# Patient Record
Sex: Male | Born: 1980 | Hispanic: Yes | Marital: Married | Smoking: Never smoker
Health system: Southern US, Community
[De-identification: ages and names within clinical notes are randomized; demographics above are authoritative.]

## PROBLEM LIST (undated history)

## (undated) DIAGNOSIS — F329 Major depressive disorder, single episode, unspecified: Secondary | ICD-10-CM

## (undated) DIAGNOSIS — F32A Depression, unspecified: Secondary | ICD-10-CM

## (undated) DIAGNOSIS — F419 Anxiety disorder, unspecified: Secondary | ICD-10-CM

## (undated) HISTORY — DX: Anxiety disorder, unspecified: F41.9

## (undated) HISTORY — DX: Depression, unspecified: F32.A

## (undated) HISTORY — DX: Major depressive disorder, single episode, unspecified: F32.9

---

## 2014-02-10 ENCOUNTER — Telehealth: Payer: Self-pay | Admitting: Cardiovascular Disease

## 2014-02-10 ENCOUNTER — Ambulatory Visit (INDEPENDENT_AMBULATORY_CARE_PROVIDER_SITE_OTHER): Payer: 59 | Admitting: Cardiovascular Disease

## 2014-02-10 ENCOUNTER — Encounter: Payer: Self-pay | Admitting: Cardiovascular Disease

## 2014-02-10 VITALS — BP 110/78 | HR 56 | Wt 159.1 lb

## 2014-02-10 DIAGNOSIS — R079 Chest pain, unspecified: Secondary | ICD-10-CM

## 2014-02-10 NOTE — Assessment & Plan Note (Signed)
Atypical but recurrent with father having prinzmetal's angina.  Clinical picture not consistent with pericardial pain. Favor cardiac CT over stress echo as it will show me  Pericardium Aorta Intramyocardial course of coronaries And any coronary anomalies  All of which are important to exclude in young person with recurrent pain  He is also thin with relative bradycardia and would have  Superb images

## 2014-02-10 NOTE — Telephone Encounter (Signed)
ROI Faxed To [facility] at [phone]

## 2014-02-10 NOTE — Patient Instructions (Signed)
Your physician recommends that you schedule a follow-up appointment in:  AS NEEDED Your physician recommends that you continue on your current medications as directed. Please refer to the Current Medication list given to you today.  Your physician has requested that you have cardiac CT. Cardiac computed tomography (CT) is a painless test that uses an x-ray machine to take clear, detailed pictures of your heart. For further information please visit [email or web address]. Please follow instruction sheet as given.

## 2014-02-10 NOTE — Progress Notes (Signed)
Patient ID: Adam MassonCarlos Barker, male   DOB: [birth year], 33 y.o.   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  While there in July has severe SSCP.  Sharp mostly at rest Longest episode 2 hours.  Seen in ER r/o with no f/u considered non cardiac.  He indicates his dad has prinzmetal's angina.  No meds and no other CRF;s  No history of lupus, inflammatory disease  Febrile illness.  No history of periciditis.  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  Usually occurs at night and can wake him from sleep.  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  All other systems reviewed and negative   General: Affect appropriate Healthy:  appears stated age HEENT: normal Neck supple with no adenopathy JVP normal no bruits no thyromegaly Lungs clear with no wheezing and good diaphragmatic motion Heart:  S1/S2 no murmur,rub, gallop or click PMI normal Abdomen: benighn, BS positve, no tenderness, no AAA no bruit.  No HSM or HJR Distal pulses intact with no bruits No edema Neuro non-focal Skin warm and dry No muscular weakness  Medications No current outpatient prescriptions on file.   No current facility-administered medications for this visit.    Allergies Review of patient's allergies indicates not on file.  Family History: Family History  Problem Relation Age of Onset  . Hypertension Father   . Hypertension Sister   . Hypertension Brother      Social History: History   Social History  . Marital Status: Married    Spouse Name: N/A    Number of Children: N/A  . Years of Education: N/A   Occupational History  . Not on file.   Social History Main Topics  . Smoking status: Never Smoker   . Smokeless tobacco: Not on file  . Alcohol Use: No  . Drug Use: Not on file  . Sexual Activity: Not on file   Other Topics Concern  . Not on file   Social History Narrative  . No narrative on file    Electrocardiogram:  NSR normal ECG  Rate 56 borderline voltage for LVH   Assessment and Plan

## 2014-02-16 ENCOUNTER — Other Ambulatory Visit: Payer: Self-pay | Admitting: *Deleted

## 2014-02-16 DIAGNOSIS — R079 Chest pain, unspecified: Secondary | ICD-10-CM

## 2014-02-18 ENCOUNTER — Other Ambulatory Visit: Payer: Self-pay | Admitting: *Deleted

## 2014-02-18 DIAGNOSIS — R079 Chest pain, unspecified: Secondary | ICD-10-CM

## 2014-03-02 ENCOUNTER — Other Ambulatory Visit (HOSPITAL_COMMUNITY): Payer: 59

## 2014-03-06 ENCOUNTER — Other Ambulatory Visit (HOSPITAL_COMMUNITY): Payer: 59

## 2014-03-11 ENCOUNTER — Telehealth: Payer: Self-pay | Admitting: Cardiovascular Disease

## 2014-03-11 ENCOUNTER — Encounter (HOSPITAL_COMMUNITY): Payer: 59

## 2014-03-11 NOTE — Telephone Encounter (Signed)
Records rec From [facility] gave to Operator Room

## 2014-03-20 ENCOUNTER — Telehealth (HOSPITAL_COMMUNITY): Payer: Self-pay

## 2014-03-22 ENCOUNTER — Emergency Department (HOSPITAL_COMMUNITY)
Admission: EM | Admit: 2014-03-22 | Discharge: 2014-03-22 | Disposition: A | Discharge status: Home / Self Care | Payer: 59 | Attending: Emergency Medicine | Admitting: Emergency Medicine

## 2014-03-22 ENCOUNTER — Encounter (HOSPITAL_COMMUNITY): Payer: Self-pay | Admitting: Emergency Medicine

## 2014-03-22 ENCOUNTER — Emergency Department (HOSPITAL_COMMUNITY): Payer: 59

## 2014-03-22 DIAGNOSIS — R079 Chest pain, unspecified: Secondary | ICD-10-CM

## 2014-03-22 DIAGNOSIS — R110 Nausea: Secondary | ICD-10-CM | POA: Insufficient documentation

## 2014-03-22 DIAGNOSIS — F411 Generalized anxiety disorder: Secondary | ICD-10-CM | POA: Insufficient documentation

## 2014-03-22 LAB — HEPATIC FUNCTION PANEL
ALT: 27 U/L (ref 0–53)
AST: 23 U/L (ref 0–37)
Albumin: 4.4 g/dL (ref 3.5–5.2)
Alkaline Phosphatase: 68 U/L (ref 39–117)
Total Bilirubin: 0.6 mg/dL (ref 0.3–1.2)
Total Protein: 7.7 g/dL (ref 6.0–8.3)

## 2014-03-22 LAB — BASIC METABOLIC PANEL
BUN: 14 mg/dL (ref 6–23)
CO2: 25 mEq/L (ref 19–32)
CREATININE: 0.67 mg/dL (ref 0.50–1.35)
Calcium: 9.6 mg/dL (ref 8.4–10.5)
Chloride: 103 mEq/L (ref 96–112)
GFR calc Af Amer: >90 mL/min (ref >90)
Glucose, Bld: 124 mg/dL — ABNORMAL HIGH (ref 70–99)
POTASSIUM: 3.9 meq/L (ref 3.7–5.3)
Sodium: 143 mEq/L (ref 137–147)

## 2014-03-22 LAB — LIPASE, BLOOD: Lipase: 30 U/L (ref 11–59)

## 2014-03-22 LAB — I-STAT TROPONIN, ED
TROPONIN I, POC: 0 ng/mL (ref 0.00–0.08)
TROPONIN I, POC: 0 ng/mL (ref 0.00–0.08)

## 2014-03-22 LAB — CBC
HEMATOCRIT: 46.5 % (ref 39.0–52.0)
Hemoglobin: 15.5 g/dL (ref 13.0–17.0)
MCH: 30.3 pg (ref 26.0–34.0)
MCHC: 33.3 g/dL (ref 30.0–36.0)
MCV: 90.8 fL (ref 78.0–100.0)
Platelets: 237 10*3/uL (ref 150–400)
RBC: 5.12 MIL/uL (ref 4.22–5.81)
RDW: 12.5 % (ref 11.5–15.5)
WBC: 11.4 10*3/uL — AB (ref 4.0–10.5)

## 2014-03-22 MED ORDER — NITROGLYCERIN 2 % TD OINT
1.0000 [in_us] | TOPICAL_OINTMENT | Freq: Once | TRANSDERMAL | Status: AC
  Administered 2014-03-22: 1 [in_us] via TOPICAL
  Filled 2014-03-22: qty 1

## 2014-03-22 MED ORDER — ASPIRIN 81 MG PO CHEW
324.0000 mg | CHEWABLE_TABLET | Freq: Once | ORAL | Status: AC
  Administered 2014-03-22: 324 mg via ORAL
  Filled 2014-03-22: qty 4

## 2014-03-22 MED ORDER — MORPHINE SULFATE 4 MG/ML IJ SOLN
4.0000 mg | Freq: Once | INTRAMUSCULAR | Status: AC
  Administered 2014-03-22: 4 mg via INTRAVENOUS
  Filled 2014-03-22: qty 1

## 2014-03-22 MED ORDER — NITROGLYCERIN 0.4 MG SL SUBL: 0.4000 mg | SUBLINGUAL_TABLET | SUBLINGUAL | Status: DC | PRN

## 2014-03-22 NOTE — Discharge Instructions (Signed)
Keep your appointments this week to get your echocardiogram done and the stress test done. If your pain returns, try the nitroglycerin spray while sitting down to see if it helps with your pain. If the pain doesn't go away go to the ED.

## 2014-03-22 NOTE — ED Notes (Signed)
I-STAT Troponin 0.00, not crossing over into the chart. Dr. Lynelle DoctorKnapp made aware.

## 2014-03-22 NOTE — ED Notes (Signed)
Rea at main lab notified that blue top blood tube has been sent.

## 2014-03-22 NOTE — ED Notes (Signed)
Pt. Stated, I have an appt with a cardiologist on Monday and Tuesday with [facility].

## 2014-03-22 NOTE — ED Notes (Signed)
Pt. Stated, I started having CP about hour half ago.Py. NV.

## 2014-03-22 NOTE — ED Provider Notes (Signed)
CSN: [id number]     Arrival date & time 03/22/14  1039 History   First MD Initiated Contact with Patient 03/22/14 1107     Chief Complaint  Patient presents with  . Chest Pain     (Consider location/radiation/quality/duration/timing/severity/associated sxs/prior Treatment) HPI Patient reports about 2 hours ago which would be about 9:00 he had acute onset of severe lower chest central pain that he describes as a squeezing discomfort. He had nausea without vomiting. He denies diaphoresis or shortness of breath. The pain does not radiate. He states he had an episode like this last summer that he went to the ED for and had testing done that were all normal. He states since that about every 3 months he has episodes that are not as intense and do not last as long. He states nothing he does makes the pain hurt more, nothing he does makes it feel better. He took ibuprofen without relief, today. He does not relate the pains to food. He states he ate a chicken sub last night with fried turnovers. He denies having a burning sensation or burning fluid coming up into his throat. He denies pain on swallowing. He states he has a very physical job that he does not get chest pain or shortness of breath doing his job. He has been recently seen by a cardiologist and has a echocardiogram and a stress test scheduled in 2 and 3 days respectively. Patient is concerned he is having "spasms" of his heart. 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 He denies any cocaine use. He states his father has angina but has not had MI. His father is [id number].   PCP  None [facility]   History reviewed. No pertinent past medical history. History reviewed. No pertinent past surgical history. Family History  Problem Relation Age of Onset  . Hypertension Father   . Hypertension Sister   . Hypertension Brother    History  Substance Use Topics  . Smoking status: Never Smoker   . Smokeless tobacco: Not on file   . Alcohol Use: No  employed  Review of Systems  All other systems reviewed and are negative.     Allergies  Review of patient's allergies indicates no known allergies.  Home Medications   Prior to Admission medications   Medication Sig Start Date End Date Taking? Authorizing Provider  ibuprofen (ADVIL,MOTRIN) 200 MG tablet Take 400 mg by mouth every 6 (six) hours as needed for moderate pain.    Yes Historical Provider, MD   BP 133/85  Pulse 68  Temp(Src) 98.9 F (37.2 C) (Oral)  Resp 14  Ht 5\' 7"  (1.702 m)  Wt 160 lb (72.576 kg)  BMI 25.05 kg/m2  SpO2 99%  Vital signs normal   Physical Exam  Nursing note and vitals reviewed. Constitutional: He is oriented to person, place, and time. He appears well-developed and well-nourished.  Non-toxic appearance. He does not appear ill. No distress.  HENT:  Head: Normocephalic and atraumatic.  Right Ear: External ear normal.  Left Ear: External ear normal.  Nose: Nose normal. No mucosal edema or rhinorrhea.  Mouth/Throat: Oropharynx is clear and moist and mucous membranes are normal. No dental abscesses or uvula swelling.  Eyes: Conjunctivae and EOM are normal. Pupils are equal, round, and reactive to light.  Neck: Normal range of motion and full passive range of motion without pain. Neck supple.  Cardiovascular: Normal rate, regular rhythm and normal heart sounds.  Exam reveals no  gallop and no friction rub.   No murmur heard. Pulmonary/Chest: Effort normal and breath sounds normal. No respiratory distress. He has no wheezes. He has no rhonchi. He has no rales. He exhibits no tenderness and no crepitus.    Area on pain noted  Abdominal: Soft. Normal appearance and bowel sounds are normal. He exhibits no distension. There is no tenderness. There is no rebound and no guarding.  Musculoskeletal: Normal range of motion. He exhibits no edema and no tenderness.  Moves all extremities well.   Neurological: He is alert and oriented to  person, place, and time. He has normal strength. No cranial nerve deficit.  Skin: Skin is warm, dry and intact. No rash noted. No erythema. No pallor.  Psychiatric: His speech is normal and behavior is normal. His mood appears anxious.    ED Course  Procedures (including critical care time)  Medications  nitroGLYCERIN (NITROGLYN) 2 % ointment 1 inch (1 inch Topical Given 03/22/14 1142)  aspirin chewable tablet 324 mg (324 mg Oral Given 03/22/14 1142)  morphine 4 MG/ML injection 4 mg (4 mg Intravenous Given 03/22/14 1142)   12:30 patient states his pain is about a 4/10. We discussed need to get a second troponin.  At time of discharge patient states his pain is a 0. He is going to followup with his cardiologist this week as previously scheduled. He was given a prescription for nitroglycerin to try in case he gets pain again. He was advised if the nitroglycerin did not help he should proceed to the emergency department anyway.  Labs Review Results for orders placed during the hospital encounter of 03/22/14  CBC      Result Value Ref Range   WBC 11.4 (*) 4.0 - 10.5 K/uL   RBC 5.12  4.22 - 5.81 MIL/uL   Hemoglobin 15.5  13.0 - 17.0 g/dL   HCT 40.9  81.1 - 91.4 %   MCV 90.8  78.0 - 100.0 fL   MCH 30.3  26.0 - 34.0 pg   MCHC 33.3  30.0 - 36.0 g/dL   RDW 78.2  95.6 - 21.3 %   Platelets 237  150 - 400 K/uL  BASIC METABOLIC PANEL      Result Value Ref Range   Sodium 143  137 - 147 mEq/L   Potassium 3.9  3.7 - 5.3 mEq/L   Chloride 103  96 - 112 mEq/L   CO2 25  19 - 32 mEq/L   Glucose, Bld 124 (*) 70 - 99 mg/dL   BUN 14  6 - 23 mg/dL   Creatinine, Ser 0.86  0.50 - 1.35 mg/dL   Calcium 9.6  8.4 - 57.8 mg/dL   GFR calc non Af Amer >90  >90 mL/min   GFR calc Af Amer >90  >90 mL/min  LIPASE, BLOOD      Result Value Ref Range   Lipase 30  11 - 59 U/L  HEPATIC FUNCTION PANEL      Result Value Ref Range   Total Protein 7.7  6.0 - 8.3 g/dL   Albumin 4.4  3.5 - 5.2 g/dL   AST 23  0 - 37 U/L    ALT 27  0 - 53 U/L   Alkaline Phosphatase 68  39 - 117 U/L   Total Bilirubin 0.6  0.3 - 1.2 mg/dL   Bilirubin, Direct <4.6  0.0 - 0.3 mg/dL   Indirect Bilirubin NOT CALCULATED  0.3 - 0.9 mg/dL  Rosezena Sensor, ED  Result Value Ref Range   Troponin i, poc 0.00  0.00 - 0.08 ng/mL   Comment 3           I-STAT TROPOININ, ED      Result Value Ref Range   Troponin i, poc 0.00  0.00 - 0.08 ng/mL   Comment 3            Laboratory interpretation all normal    Imaging Review Dg Chest Portable 1 View  03/22/2014   CLINICAL DATA:  CHEST PAIN  EXAM: PORTABLE CHEST - 1 VIEW  COMPARISON:  None available  FINDINGS: Lungs are clear. Heart size and mediastinal contours are within normal limits. No effusion.  No pneumothorax Visualized skeletal structures are unremarkable.  IMPRESSION: No acute cardiopulmonary disease.   Electronically Signed   By: Oley Balmaniel  Hassell M.D.   On: 03/22/2014 11:44     EKG Interpretation   Date/Time:  Sunday Mar 22 2014 10:42:18 EDT Ventricular Rate:  74 PR Interval:  130 QRS Duration: 92 QT Interval:  368 QTC Calculation: 408 R Axis:   81 Text Interpretation:  Normal sinus rhythm Normal ECG No old tracing to  compare Confirmed by Jolly Bleicher  MD-I, Beanca Kiester ([id number]) on 03/22/2014 11:09:13 AM      MDM   patient presents with severe intense lower sternal chest pain that he describes as squeezing. It does not appear to be esophageal in that it does not hurt when he swallows. It also does not appear to be reflux, there is no burning fluid in his throat. However it also does not appear to be cardiac. 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 He already has an appointment to be seen this week to get an echocardiogram and a stress test. He is going to follow through with his cardiologist.    Final diagnoses:  Chest pain     Discharge Medication List as of 03/22/2014  2:38 PM    START taking these  medications   Details  nitroGLYCERIN (NITROSTAT) 0.4 MG SL tablet Place 1 tablet (0.4 mg total) under the tongue every 5 (five) minutes as needed for chest pain., Starting 03/22/2014, Until Discontinued, Print        Plan discharge  Devoria AlbeIva Jatavion Peaster, MD, [facility]    Kymberlie Brazeau L Mizani Dilday, MD 03/22/14 1900

## 2014-03-24 ENCOUNTER — Ambulatory Visit (HOSPITAL_COMMUNITY): Payer: 59 | Attending: Cardiology | Admitting: Radiology

## 2014-03-24 ENCOUNTER — Encounter (INDEPENDENT_AMBULATORY_CARE_PROVIDER_SITE_OTHER): Payer: Self-pay

## 2014-03-24 ENCOUNTER — Telehealth (HOSPITAL_COMMUNITY): Payer: Self-pay

## 2014-03-24 DIAGNOSIS — R079 Chest pain, unspecified: Secondary | ICD-10-CM | POA: Insufficient documentation

## 2014-03-24 NOTE — Progress Notes (Signed)
Echocardiogram performed.  

## 2014-03-25 ENCOUNTER — Ambulatory Visit (HOSPITAL_COMMUNITY)
Admission: RE | Admit: 2014-03-25 | Discharge: 2014-03-25 | Disposition: A | Discharge status: Home / Self Care | Payer: 59 | Source: Ambulatory Visit | Attending: Cardiovascular Disease | Admitting: Cardiovascular Disease

## 2014-03-25 ENCOUNTER — Other Ambulatory Visit: Payer: Self-pay

## 2014-03-25 DIAGNOSIS — R079 Chest pain, unspecified: Secondary | ICD-10-CM

## 2014-04-01 ENCOUNTER — Telehealth: Payer: Self-pay | Admitting: *Deleted

## 2014-04-01 NOTE — Telephone Encounter (Signed)
PT NOTIFIED GXT  AND ECHO  WERE NORMAL  PER  DR NISHAN./CY

## 2014-05-13 NOTE — Telephone Encounter (Signed)
Encounter complete. 

## 2014-05-28 NOTE — Telephone Encounter (Signed)
Encounter complete. 

## 2015-02-25 ENCOUNTER — Encounter (HOSPITAL_COMMUNITY): Payer: Self-pay

## 2015-02-25 ENCOUNTER — Encounter (HOSPITAL_COMMUNITY): Payer: Self-pay | Admitting: Psychiatry

## 2015-02-25 ENCOUNTER — Ambulatory Visit (INDEPENDENT_AMBULATORY_CARE_PROVIDER_SITE_OTHER): Payer: PRIVATE HEALTH INSURANCE | Admitting: Psychiatry

## 2015-02-25 VITALS — BP 126/70 | HR 70 | Ht 67.5 in | Wt 178.0 lb

## 2015-02-25 DIAGNOSIS — F411 Generalized anxiety disorder: Secondary | ICD-10-CM | POA: Diagnosis not present

## 2015-02-25 DIAGNOSIS — F322 Major depressive disorder, single episode, severe without psychotic features: Secondary | ICD-10-CM | POA: Diagnosis not present

## 2015-02-25 MED ORDER — CITALOPRAM HYDROBROMIDE 20 MG PO TABS: ORAL_TABLET | ORAL | Status: DC

## 2015-02-25 NOTE — Progress Notes (Signed)
Patient ID: Adam Barker, male   DOB: January 06, 1981, 34 y.o.   MRN: [id number]  [facility]   Adam Barker [id number] 34 y.o.  02/25/2015 11:21 AM  Chief Complaint:  depression  History of Present Illness:   Patient Presents for Initial Evaluation with symptoms of depression. Calm nose is a 34 years old currently married [place] male.  He presents with depression building up for the last 8-9 months. They had a unplanned pregnancy baby is born now 6 months. He stressed about finances. He is endorsing tearfulness, hopelessness, anhedonia, decreased interest, not able to work on a regular basis he has taken lots of days off because he wanted take care of the baby and his wife is also working the same schedule. He and his wife divided day so that they can take care of the baby he is worried that he may lose his job or need FMLA. He is not endorsing psychotic symptoms, paranoia or hallucinations. He does not endorse hopelessness or suicidal thoughts but he's feeling down depressed.  Aggravating factors; job stress, 637-month-old baby, finances, homelessness family is in [place]. Mom diagnosed with non-Hodgkin's lymphoma he stressed about that.  Modifying factors: Baby, stable relationship with his wife. Severity of depression; 3/10. 10 being no depression There is no current manic symptoms or in the past. Does not endorse panic attacks but he does endorse generalized anxiety excessive worry sometimes unreasonable or excessive. Muscle tightening at night and difficulty sleeping There is no substance abuse or alcohol use on a regular basis  Does not endorse any physical or sexual trauma in the past     Past Psychiatric History/Hospitalization(s) None. No treatement before  Hospitalization for psychiatric illness: No History of Electroconvulsive Shock Therapy: No Prior Suicide Attempts: No  Medical History; Past Medical History   Diagnosis Date  . Depression   . Anxiety     Allergies: No Known Allergies  Medications: Outpatient Encounter Prescriptions as of 02/25/2015  Medication Sig  . ibuprofen (ADVIL,MOTRIN) 200 MG tablet Take 400 mg by mouth every 6 (six) hours as needed for moderate pain.   . citalopram (CELEXA) 20 MG tablet Take half table tfor first 4 days then one tablet a day.  . [DISCONTINUED] nitroGLYCERIN (NITROSTAT) 0.4 MG SL tablet Place 1 tablet (0.4 mg total) under the tongue every 5 (five) minutes as needed for chest pain. (Patient not taking: Reported on 02/25/2015)     Substance Abuse History:  denies Family History; Family History  Problem Relation Age of Onset  . Hypertension Father   . Hypertension Sister   . Hypertension Brother    Not aware of family hisotry. He didn't know his Dad. He grew up with his mom   Biopsychosocial History:  He grew up with his mom because his parents divorced when he was young. There is no physical sexual trauma. He did finish high school. He has been living in growing up in [place]. Recently moved because of possible job and [place]. There is no legal issue currently is married and has a 587-month-old kid    Labs:  No results found for this or any previous visit (from the past 2160 hour(s)).     Musculoskeletal: Strength & Muscle Tone: within normal limits Gait & Station: normal Patient leans: N/A  Mental Status Examination;   Psychiatric Specialty Exam: Physical Exam  Constitutional: He appears well-developed and well-nourished.  Skin: He is not diaphoretic.  Review of Systems  Constitutional: Negative.   Cardiovascular: Negative for chest pain.  Skin: Negative for rash.  Psychiatric/Behavioral: Positive for depression. Negative for suicidal ideas and substance abuse. The patient is nervous/anxious.     Blood pressure 126/70, pulse 70, height 5' 7.5" (1.715 m), weight 178 lb (80.74 kg).Body mass index is 27.45  kg/(m^2).  General Appearance: Casual  Eye Contact::  Fair  Speech:  Normal Rate  Volume:  Normal  Mood:  Dysphoric  Affect:  Congruent  Thought Process:  Coherent  Orientation:  Full (Time, Place, and Person)  Thought Content:  Rumination  Suicidal Thoughts:  No  Homicidal Thoughts:  No  Memory:  Immediate;   Fair Recent;   Fair  Judgement:  Fair  Insight:  Present  Psychomotor Activity:  Normal  Concentration:  Fair  Recall:  Fair  Akathisia:  Negative  Handed:  Right  AIMS (if indicated):     Assets:  Communication Skills Desire for Improvement  Sleep:        Assessment: Axis I: Major depressive disorder single episode severe. Generalized anxiety disorder  Axis II: Deferred  Axis III:  Past Medical History  Diagnosis Date  . Depression   . Anxiety     Axis IV: Psychosocial, newborn baby. Finances   Treatment Plan and Summary: I will start him on an SSRI Celexa 10 mg to 20 mg discussing side effects. We'll add trazodone for sleep at night for the first one month. Recommend therapy but as of now he is looking forward to get started on medications.  His concern about his job he will go back to work but this stress is severe he may need a few days off for FMLA. Pertinent Labs and Relevant Prior Notes reviewed. Medication Side effects, benefits and risks reviewed/discussed with Patient. Time given for patient to respond and asks questions regarding the Diagnosis and Medications. Safety concerns and to report to ER if suicidal or call 911. Relevant Medications refilled or called in to pharmacy. Discussed weight maintenance and Sleep Hygiene. Follow up with Primary care provider in regards to Medical conditions. Recommend compliance with medications and follow up office appointments. Discussed to avail opportunity to consider or/and continue Individual therapy with Counselor. Greater than 50% of time was spend in counseling and coordination of care with the  patient.  Schedule for Follow up visit in 3 weeks or call in earlier as necessary.   Thresa Ross, MD 02/25/2015

## 2015-03-10 ENCOUNTER — Encounter (HOSPITAL_COMMUNITY): Payer: Self-pay | Admitting: Psychiatry

## 2015-03-10 ENCOUNTER — Ambulatory Visit (INDEPENDENT_AMBULATORY_CARE_PROVIDER_SITE_OTHER): Payer: 59 | Admitting: Psychiatry

## 2015-03-10 VITALS — BP 110/80 | HR 80 | Ht 67.5 in | Wt 178.0 lb

## 2015-03-10 DIAGNOSIS — F411 Generalized anxiety disorder: Secondary | ICD-10-CM | POA: Diagnosis not present

## 2015-03-10 DIAGNOSIS — F322 Major depressive disorder, single episode, severe without psychotic features: Secondary | ICD-10-CM

## 2015-03-10 MED ORDER — TRAZODONE HCL 50 MG PO TABS: 50.0000 mg | ORAL_TABLET | Freq: Every day | ORAL | Status: DC

## 2015-03-10 MED ORDER — CITALOPRAM HYDROBROMIDE 20 MG PO TABS
ORAL_TABLET | ORAL | Status: DC
Start: 2015-03-10 — End: 2015-11-11

## 2015-03-10 NOTE — Progress Notes (Signed)
Patient ID: Adam MassonCarlos Hutcherson, male   DOB: 11-May-1981, 34 y.o.   MRN: [id number]  [facility]  Adam MassonCarlos Crewe [id number] 34 y.o.  03/10/2015 10:07 AM  Chief Complaint:  depression  History of Present Illness:   Patient Presents for follow-up and medication management for major depressive disorder and generalized anxiety disorder. Patient is a 34 years old currently married [place] descent male.  He presented initially with symptoms of depression and you born baby financial stress difficult managing jobs because he and his wife work at the same job he was losing days and finances. His mom was diagnosed with lymphoma. We started him on Celexa that has helped is less teary less depressed but still worries about snapping of the finances he has gone back to work but hopelessness is decreased. There is no reported side effects from the Celexa. The plan that they may have to take [place] to [place] so that patient can continue work here or transfer their job [place] Still concern of difficulty sleeping and wants a sleep aid.  Aggravating factors; job stress, 827 -month-old baby, finances, homelessness family is in [place]. Mom diagnosed with non-Hodgkin's lymphoma he stressed about that.  Modifying factors: Baby, stable relationship with his wife. Severity of depression; 6/10. 10 being no depression. Depression has improved since last visit There is no current manic symptoms or in the past. Does not endorse panic attacks but he does endorse generalized anxiety excessive worry sometimes unreasonable or excessive. Muscle tightening at night and difficulty sleeping There is no substance abuse or alcohol use on a regular basis  Does not endorse any physical or sexual trauma in the past     Past Psychiatric History/Hospitalization(s) None. No treatement before  Hospitalization for psychiatric illness: No History of Electroconvulsive Shock  Therapy: No Prior Suicide Attempts: No  Medical History; Past Medical History  Diagnosis Date  . Depression   . Anxiety     Allergies: No Known Allergies  Medications: Outpatient Encounter Prescriptions as of 03/10/2015  Medication Sig  . citalopram (CELEXA) 20 MG tablet Take one a day.  . ibuprofen (ADVIL,MOTRIN) 200 MG tablet Take 400 mg by mouth every 6 (six) hours as needed for moderate pain.   . traZODone (DESYREL) 50 MG tablet Take 1 tablet (50 mg total) by mouth at bedtime.  . [DISCONTINUED] citalopram (CELEXA) 20 MG tablet Take half table tfor first 4 days then one tablet a day.     Substance Abuse History:  denies Family History; Family History  Problem Relation Age of Onset  . Hypertension Father   . Hypertension Sister   . Hypertension Brother    Not aware of family hisotry. He didn't know his Dad. He grew up with his mom    Labs:  No results found for this or any previous visit (from the past 2160 hour(s)).     Musculoskeletal: Strength & Muscle Tone: within normal limits Gait & Station: normal Patient leans: N/A  Mental Status Examination;   Psychiatric Specialty Exam: Physical Exam  Constitutional: He appears well-developed and well-nourished.  Skin: He is not diaphoretic.    Review of Systems  Constitutional: Negative.   Cardiovascular: Negative for chest pain.  Gastrointestinal: Negative for nausea.  Neurological: Negative for headaches.  Psychiatric/Behavioral: Positive for depression. Negative for suicidal ideas and substance abuse. The patient has insomnia.     Blood pressure 110/80, pulse 80, height 5' 7.5" (1.715 m), weight 178 lb (80.74  kg).Body mass index is 27.45 kg/(m^2).  General Appearance: Casual  Eye Contact::  Fair  Speech:  Normal Rate  Volume:  Normal  Mood:  Less dysphoric and not tearful  Affect:  Congruent  Thought Process:  Coherent  Orientation:  Full (Time, Place, and Person)  Thought Content:  Rumination   Suicidal Thoughts:  No  Homicidal Thoughts:  No  Memory:  Immediate;   Fair Recent;   Fair  Judgement:  Fair  Insight:  Present  Psychomotor Activity:  Normal  Concentration:  Fair  Recall:  Fair  Akathisia:  Negative  Handed:  Right  AIMS (if indicated):     Assets:  Communication Skills Desire for Improvement  Sleep:        Assessment: Axis I: Major depressive disorder single episode severe. Generalized anxiety disorder  Axis II: Deferred  Axis III:  Past Medical History  Diagnosis Date  . Depression   . Anxiety     Axis IV: Psychosocial, newborn baby. Finances   Treatment Plan and Summary:  Continue Celexa 20 mg. He needs a note that he has been off work in the past because of the depression. He is skills scheduled for therapy considering his ongoing stressors Add trazodone 50 mg for sleep. Pertinent Labs and Relevant Prior Notes reviewed. Medication Side effects, benefits and risks reviewed/discussed with Patient. Time given for patient to respond and asks questions regarding the Diagnosis and Medications. Safety concerns and to report to ER if suicidal or call 911. Relevant Medications refilled or called in to pharmacy. Discussed weight maintenance and Sleep Hygiene. Follow up with Primary care provider in regards to Medical conditions. Recommend compliance with medications and follow up office appointments. Discussed to avail opportunity to consider or/and continue Individual therapy with Counselor. Greater than 50% of time was spend in counseling and coordination of care with the patient.  Schedule for Follow up visit in 3 weeks or call in earlier as necessary.   Thresa Ross, MD 03/10/2015

## 2015-04-07 ENCOUNTER — Ambulatory Visit (HOSPITAL_COMMUNITY): Payer: Self-pay | Admitting: Psychiatry

## 2015-04-15 IMAGING — CR DG CHEST 1V PORT
1 series · 1 of 1 positions shown · non-contrast
Comparison: None available

CLINICAL DATA: CHEST PAIN

EXAM:
PORTABLE CHEST - 1 VIEW

[AP]
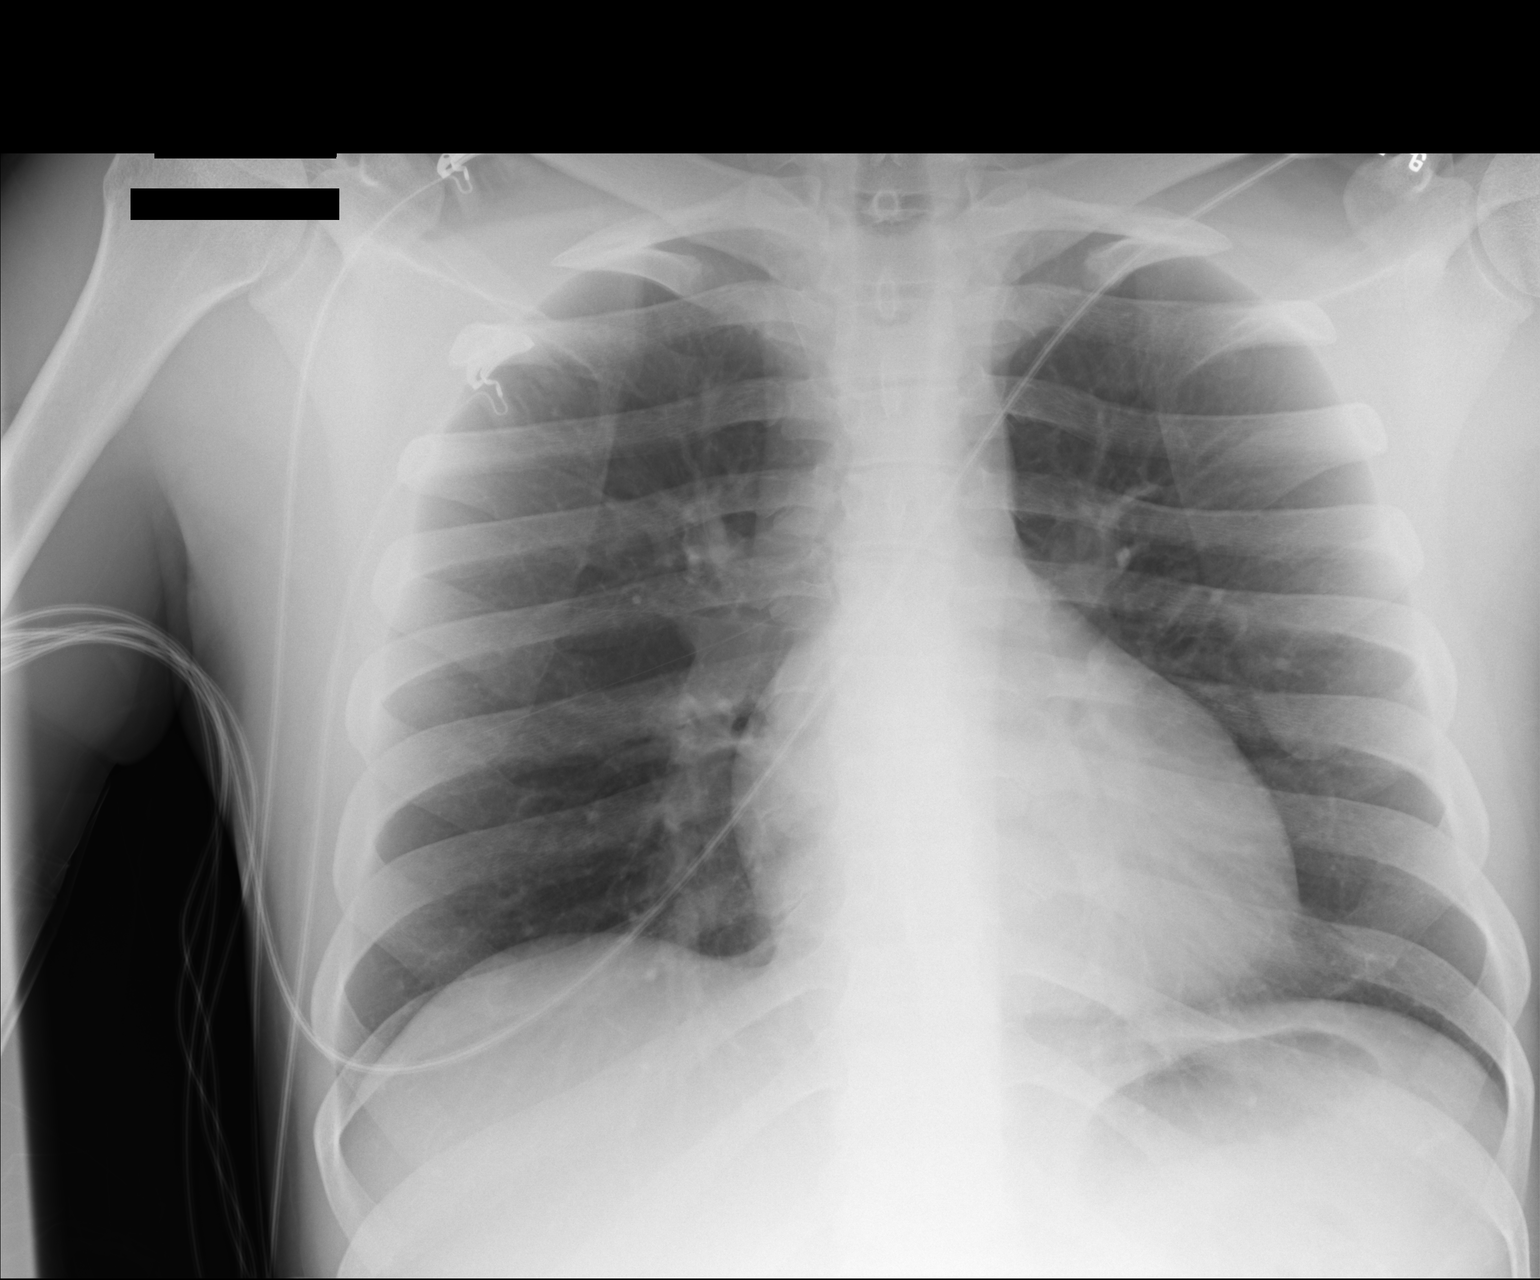

[1 of 1 positions shown; findings below may reference images not displayed]

FINDINGS: Lungs are clear. Heart size and mediastinal contours are within
normal limits.
No effusion.  No pneumothorax
Visualized skeletal structures are unremarkable.
IMPRESSION: No acute cardiopulmonary disease.

## 2015-08-05 ENCOUNTER — Emergency Department (HOSPITAL_COMMUNITY): Payer: 59

## 2015-08-05 ENCOUNTER — Emergency Department (HOSPITAL_COMMUNITY)
Admission: EM | Admit: 2015-08-05 | Discharge: 2015-08-05 | Disposition: A | Discharge status: Home / Self Care | Payer: 59 | Attending: Emergency Medicine | Admitting: Emergency Medicine

## 2015-08-05 DIAGNOSIS — Z79899 Other long term (current) drug therapy: Secondary | ICD-10-CM | POA: Insufficient documentation

## 2015-08-05 DIAGNOSIS — F329 Major depressive disorder, single episode, unspecified: Secondary | ICD-10-CM | POA: Insufficient documentation

## 2015-08-05 DIAGNOSIS — R079 Chest pain, unspecified: Secondary | ICD-10-CM | POA: Diagnosis present

## 2015-08-05 DIAGNOSIS — F419 Anxiety disorder, unspecified: Secondary | ICD-10-CM | POA: Insufficient documentation

## 2015-08-05 DIAGNOSIS — R0789 Other chest pain: Secondary | ICD-10-CM | POA: Diagnosis not present

## 2015-08-05 LAB — I-STAT TROPONIN, ED: Troponin i, poc: 0 ng/mL (ref 0.00–0.08)

## 2015-08-05 LAB — CBC
HEMATOCRIT: 44 % (ref 39.0–52.0)
Hemoglobin: 14.7 g/dL (ref 13.0–17.0)
MCH: 30.3 pg (ref 26.0–34.0)
MCHC: 33.4 g/dL (ref 30.0–36.0)
MCV: 90.7 fL (ref 78.0–100.0)
Platelets: 234 10*3/uL (ref 150–400)
RBC: 4.85 MIL/uL (ref 4.22–5.81)
RDW: 13 % (ref 11.5–15.5)
WBC: 11.6 10*3/uL — AB (ref 4.0–10.5)

## 2015-08-05 LAB — BASIC METABOLIC PANEL
ANION GAP: 7 (ref 5–15)
BUN: 13 mg/dL (ref 6–20)
CALCIUM: 9.7 mg/dL (ref 8.9–10.3)
CO2: 29 mmol/L (ref 22–32)
Chloride: 104 mmol/L (ref 101–111)
Creatinine, Ser: 0.67 mg/dL (ref 0.61–1.24)
GFR calc Af Amer: >60 mL/min (ref >60)
Glucose, Bld: 132 mg/dL — ABNORMAL HIGH (ref 65–99)
POTASSIUM: 4 mmol/L (ref 3.5–5.1)
SODIUM: 140 mmol/L (ref 135–145)

## 2015-08-05 MED ORDER — NITROGLYCERIN 2 % TD OINT
0.5000 [in_us] | TOPICAL_OINTMENT | Freq: Four times a day (QID) | TRANSDERMAL | Status: DC
  Filled 2015-08-05: qty 30

## 2015-08-05 MED ORDER — HYDROCODONE-ACETAMINOPHEN 5-325 MG PO TABS: 1.0000 | ORAL_TABLET | Freq: Four times a day (QID) | ORAL | Status: DC | PRN

## 2015-08-05 MED ORDER — ASPIRIN 81 MG PO CHEW
324.0000 mg | CHEWABLE_TABLET | Freq: Once | ORAL | Status: DC
  Filled 2015-08-05: qty 4

## 2015-08-05 MED ORDER — MORPHINE SULFATE (PF) 4 MG/ML IV SOLN
4.0000 mg | Freq: Once | INTRAVENOUS | Status: DC
Start: 2015-08-05 — End: 2015-08-05
  Filled 2015-08-05: qty 1

## 2015-08-05 NOTE — ED Notes (Signed)
Patient refused blood draw for I stat troponin-RN Pearson Grippe and EDP Nanavati notified

## 2015-08-05 NOTE — ED Provider Notes (Addendum)
CSN: [id number]     Arrival date & time 08/05/15  0908 History   First MD Initiated Contact with Patient 08/05/15 1004     Chief Complaint  Patient presents with  . Chest Pain     (Consider location/radiation/quality/duration/timing/severity/associated sxs/prior Treatment) HPI Comments: Pt comes in with cc of chest pain. Chest pain is mid and substernal, and it is non radiating. The pain started around 4 am. Whilst he was asleep and it has been constant since then. He took his old percocet, with no relief. Pt has no associated numbness, dizziness, dib, sweats. He had a stress test last year which was neg. Pt reports that he gets pain like this 2 times a year - but the pain came back 2 times in a week this time around - and it was more severe, so he decided to come to the ER. Pt's father has prinzemetal's angina hx.   Patient is a 34 y.o. male presenting with chest pain. The history is provided by the patient.  Chest Pain Associated symptoms: no abdominal pain, no cough and no shortness of breath     Past Medical History  Diagnosis Date  . Depression   . Anxiety    No past surgical history on file. Family History  Problem Relation Age of Onset  . Hypertension Father   . Hypertension Sister   . Hypertension Brother    Social History  Substance Use Topics  . Smoking status: Never Smoker   . Smokeless tobacco: Never Used  . Alcohol Use: No    Review of Systems  Constitutional: Negative for activity change and appetite change.  Respiratory: Negative for cough and shortness of breath.   Cardiovascular: Positive for chest pain.  Gastrointestinal: Negative for abdominal pain.  Genitourinary: Negative for dysuria.      Allergies  Review of patient's allergies indicates no known allergies.  Home Medications   Prior to Admission medications   Medication Sig Start Date End Date Taking? Authorizing Provider  Naphazoline-Pheniramine (OPCON-A) 0.027-0.315 % SOLN Place 1 drop  into both eyes daily.   Yes Historical Provider, MD  citalopram (CELEXA) 20 MG tablet Take one a day. 03/10/15   Thresa Ross, MD  HYDROcodone-acetaminophen (NORCO/VICODIN) 5-325 MG per tablet Take 1 tablet by mouth every 6 (six) hours as needed. 08/05/15   Derwood Kaplan, MD  ibuprofen (ADVIL,MOTRIN) 200 MG tablet Take 400 mg by mouth every 6 (six) hours as needed for moderate pain.     Historical Provider, MD  traZODone (DESYREL) 50 MG tablet Take 1 tablet (50 mg total) by mouth at bedtime. 03/10/15   Thresa Ross, MD   BP 136/71 mmHg  Pulse 102  Resp 18  SpO2 95% Physical Exam  Constitutional: He is oriented to person, place, and time. He appears well-developed.  HENT:  Head: Atraumatic.  Neck: Neck supple.  Cardiovascular: Normal rate, regular rhythm and intact distal pulses.   Pulmonary/Chest: Effort normal. No respiratory distress. He has no wheezes.  Abdominal: Soft. He exhibits no distension. There is no tenderness.  Neurological: He is alert and oriented to person, place, and time.  Skin: Skin is warm.  Nursing note and vitals reviewed.   ED Course  Procedures (including critical care time) Labs Review Labs Reviewed  BASIC METABOLIC PANEL - Abnormal; Notable for the following:    Glucose, Bld 132 (*)    All other components within normal limits  CBC - Abnormal; Notable for the following:    WBC 11.6 (*)  All other components within normal limits  I-STAT TROPOININ, ED    Imaging Review No results found. I have personally reviewed and evaluated these images and lab results as part of my medical decision-making.   EKG Interpretation   Date/Time:  Thursday August 05 2015 09:18:50 EDT Ventricular Rate:  65 PR Interval:  145 QRS Duration: 91 QT Interval:  376 QTC Calculation: 391 R Axis:   75 Text Interpretation:  Sinus rhythm Baseline wander in lead(s) V3 V4 V5  normal. no change. Confirmed by Donnald Garre, MD, Lebron Conners [phone]) on 08/05/2015  9:34:51 AM       MDM   Final diagnoses:  Atypical chest pain    Differential diagnosis includes: ACS syndrome Prinzemetals angina Musculoskeletal chest pain  Chest pain - typical of his regular chest pain. Neg stress last year. HEAR score is 0.  Will d.c.  ? Prinzemetals given the pain is severe on occasion and he has fam hx of prinzemetals, so asked him to continue cards f/u.    Derwood Kaplan, MD 08/05/15 1251  Derwood Kaplan, MD 08/17/15 2349

## 2015-08-05 NOTE — Progress Notes (Addendum)
[facility] ED CM noted pt with coverage but no pcp listed Spoke with pt who confirms no pcp  [facility] ED CM spoke with pt on how to obtain an in network pcp with insurance coverage via the customer service number or web site  Cm reviewed ED level of care for crisis/emergent services and community pcp level of care to manage continuous or chronic medical concerns.  The pt voiced understanding CM encouraged pt and discussed pt's responsibility to verify with pt's insurance carrier that any recommended medical provider offered by any emergency room or a hospital provider is within the carrier's network. The pt voiced understanding  CM answered questions about united health care coverage, internal medicine doctors, cost, etc

## 2015-08-05 NOTE — Discharge Instructions (Signed)
The Cardiologist have to see you and diagnose with prinzmetal before it is safe to start any meds. Please see the Cardiologist as requested. Also Primary care info provided below.  Please return to the ER if you have worsening chest pain, shortness of breath, pain radiating to your jaw, shoulder, or back, sweats or fainting. Otherwise see the Cardiologist or your primary care doctor as requested.    Chest Pain (Nonspecific) It is often hard to give a diagnosis for the cause of chest pain. There is always a chance that your pain could be related to something serious, such as a heart attack or a blood clot in the lungs. You need to follow up with your doctor. HOME CARE  If antibiotic medicine was given, take it as directed by your doctor. Finish the medicine even if you start to feel better.  For the next few days, avoid activities that bring on chest pain. Continue physical activities as told by your doctor.  Do not use any tobacco products. This includes cigarettes, chewing tobacco, and e-cigarettes.  Avoid drinking alcohol.  Only take medicine as told by your doctor.  Follow your doctor's suggestions for more testing if your chest pain does not go away.  Keep all doctor visits you made. GET HELP IF:  Your chest pain does not go away, even after treatment.  You have a rash with blisters on your chest.  You have a fever. GET HELP RIGHT AWAY IF:   You have more pain or pain that spreads to your arm, neck, jaw, back, or belly (abdomen).  You have shortness of breath.  You cough more than usual or cough up blood.  You have very bad back or belly pain.  You feel sick to your stomach (nauseous) or throw up (vomit).  You have very bad weakness.  You pass out (faint).  You have chills. This is an emergency. Do not wait to see if the problems will go away. Call your local emergency services (911 in [place] Do not drive yourself to the hospital. MAKE SURE YOU:   Understand  these instructions.  Will watch your condition.  Will get help right away if you are not doing well or get worse. Document Released: 04/17/2008 Document Revised: 11/04/2013 Document Reviewed: 04/17/2008 [facility] Patient Information 2015 [place], [place]. This information is not intended to replace advice given to you by your health care provider. Make sure you discuss any questions you have with your health care provider.

## 2015-08-05 NOTE — ED Notes (Signed)
Per pt, states chest pain-states he has had before and has been worked up in past and they have found nothing-states it is not cardiac related

## 2015-10-18 ENCOUNTER — Encounter (HOSPITAL_COMMUNITY): Payer: Self-pay | Admitting: *Deleted

## 2015-10-18 ENCOUNTER — Emergency Department (HOSPITAL_COMMUNITY)
Admission: EM | Admit: 2015-10-18 | Discharge: 2015-10-18 | Disposition: A | Discharge status: Home / Self Care | Payer: 59 | Attending: Emergency Medicine | Admitting: Emergency Medicine

## 2015-10-18 ENCOUNTER — Emergency Department (HOSPITAL_COMMUNITY): Payer: 59

## 2015-10-18 DIAGNOSIS — F329 Major depressive disorder, single episode, unspecified: Secondary | ICD-10-CM | POA: Diagnosis not present

## 2015-10-18 DIAGNOSIS — F419 Anxiety disorder, unspecified: Secondary | ICD-10-CM | POA: Diagnosis not present

## 2015-10-18 DIAGNOSIS — R079 Chest pain, unspecified: Secondary | ICD-10-CM | POA: Insufficient documentation

## 2015-10-18 LAB — BASIC METABOLIC PANEL
ANION GAP: 7 (ref 5–15)
BUN: 12 mg/dL (ref 6–20)
CHLORIDE: 104 mmol/L (ref 101–111)
CO2: 28 mmol/L (ref 22–32)
Calcium: 9.4 mg/dL (ref 8.9–10.3)
Creatinine, Ser: 0.78 mg/dL (ref 0.61–1.24)
GFR calc Af Amer: >60 mL/min (ref >60)
GLUCOSE: 139 mg/dL — AB (ref 65–99)
POTASSIUM: 3.4 mmol/L — AB (ref 3.5–5.1)
Sodium: 139 mmol/L (ref 135–145)

## 2015-10-18 LAB — HEPATIC FUNCTION PANEL
ALT: 32 U/L (ref 17–63)
AST: 21 U/L (ref 15–41)
Albumin: 4.2 g/dL (ref 3.5–5.0)
Alkaline Phosphatase: 67 U/L (ref 38–126)
Bilirubin, Direct: <0.1 mg/dL — ABNORMAL LOW (ref 0.1–0.5)
Total Bilirubin: 0.4 mg/dL (ref 0.3–1.2)
Total Protein: 6.9 g/dL (ref 6.5–8.1)

## 2015-10-18 LAB — CBC
HEMATOCRIT: 42.3 % (ref 39.0–52.0)
HEMOGLOBIN: 14.1 g/dL (ref 13.0–17.0)
MCH: 30.2 pg (ref 26.0–34.0)
MCHC: 33.3 g/dL (ref 30.0–36.0)
MCV: 90.6 fL (ref 78.0–100.0)
Platelets: 144 10*3/uL — ABNORMAL LOW (ref 150–400)
RBC: 4.67 MIL/uL (ref 4.22–5.81)
RDW: 12.7 % (ref 11.5–15.5)
WBC: 10.1 10*3/uL (ref 4.0–10.5)

## 2015-10-18 LAB — LIPASE, BLOOD: LIPASE: 38 U/L (ref 11–51)

## 2015-10-18 LAB — I-STAT TROPONIN, ED: Troponin i, poc: 0 ng/mL (ref 0.00–0.08)

## 2015-10-18 MED ORDER — FENTANYL CITRATE (PF) 100 MCG/2ML IJ SOLN
100.0000 ug | Freq: Once | INTRAMUSCULAR | Status: AC
  Administered 2015-10-18: 100 ug via INTRAVENOUS
  Filled 2015-10-18: qty 2

## 2015-10-18 MED ORDER — PROCHLORPERAZINE EDISYLATE 5 MG/ML IJ SOLN
10.0000 mg | Freq: Once | INTRAMUSCULAR | Status: AC
  Administered 2015-10-18: 10 mg via INTRAVENOUS
  Filled 2015-10-18: qty 2

## 2015-10-18 MED ORDER — OXYCODONE-ACETAMINOPHEN 5-325 MG PO TABS
2.0000 | ORAL_TABLET | Freq: Once | ORAL | Status: DC
  Filled 2015-10-18: qty 2

## 2015-10-18 MED ORDER — FAMOTIDINE IN NACL 20-0.9 MG/50ML-% IV SOLN
20.0000 mg | Freq: Once | INTRAVENOUS | Status: AC
  Administered 2015-10-18: 20 mg via INTRAVENOUS
  Filled 2015-10-18: qty 50

## 2015-10-18 MED ORDER — PANTOPRAZOLE SODIUM 40 MG IV SOLR
40.0000 mg | INTRAVENOUS | Status: AC
  Administered 2015-10-18: 40 mg via INTRAVENOUS
  Filled 2015-10-18: qty 40

## 2015-10-18 MED ORDER — SODIUM CHLORIDE 0.9 % IV BOLUS (SEPSIS)
1000.0000 mL | Freq: Once | INTRAVENOUS | Status: AC
  Administered 2015-10-18: 1000 mL via INTRAVENOUS

## 2015-10-18 MED ORDER — GI COCKTAIL ~~LOC~~
30.0000 mL | Freq: Once | ORAL | Status: AC
  Administered 2015-10-18: 30 mL via ORAL
  Filled 2015-10-18: qty 30

## 2015-10-18 MED ORDER — ONDANSETRON HCL 4 MG/2ML IJ SOLN
4.0000 mg | Freq: Once | INTRAMUSCULAR | Status: AC
  Administered 2015-10-18: 4 mg via INTRAVENOUS
  Filled 2015-10-18: qty 2

## 2015-10-18 NOTE — ED Provider Notes (Signed)
CSN: [id number]     Arrival date & time 10/18/15  0427 History   First MD Initiated Contact with Patient 10/18/15 0505     Chief Complaint  Patient presents with  . Chest Pain     (Consider location/radiation/quality/duration/timing/severity/associated sxs/prior Treatment) HPI Comments: The patient is a 34 year old male with a history of depression and anxiety who states that at 10:00 last night he started to develop some mild epigastric discomfort and felt like he was going to have chest pain. At 1:00 AM he awoke having much increased chest pain feeling like a squeezing or heaviness on his chest, he points to his epigastrium. He has no shortness of breath, no other symptoms other than mild nausea. He has had these symptoms many times in the past and has been through many workups including a cardiac workup with a stress test 18 months ago which was normal. He reports that this pain is exactly the same as the other pains in the past. He was supposed to see a cardiologist but has not seen them yet due to insurance reasons. The patient reports that he does not exercise regularly but does work for [employer] and stays very active with his job. He never has pain when he is at work. The pain usually happens at night. He denies alcohol use or ibuprofen use  Patient is a 35 y.o. male presenting with chest pain. The history is provided by the patient.  Chest Pain   Past Medical History  Diagnosis Date  . Depression   . Anxiety    History reviewed. No pertinent past surgical history. Family History  Problem Relation Age of Onset  . Hypertension Father   . Hypertension Sister   . Hypertension Brother    Social History  Substance Use Topics  . Smoking status: Never Smoker   . Smokeless tobacco: Never Used  . Alcohol Use: No    Review of Systems  Cardiovascular: Positive for chest pain.  All other systems reviewed and are negative.     Allergies  Review of patient's allergies  indicates no known allergies.  Home Medications   Prior to Admission medications   Medication Sig Start Date End Date Taking? Authorizing Provider  citalopram (CELEXA) 20 MG tablet Take one a day. Patient not taking: Reported on 10/18/2015 03/10/15   Thresa Ross, MD  HYDROcodone-acetaminophen (NORCO/VICODIN) 5-325 MG per tablet Take 1 tablet by mouth every 6 (six) hours as needed. Patient not taking: Reported on 10/18/2015 08/05/15   Derwood Kaplan, MD  traZODone (DESYREL) 50 MG tablet Take 1 tablet (50 mg total) by mouth at bedtime. Patient not taking: Reported on 10/18/2015 03/10/15   Thresa Ross, MD   BP 136/85 mmHg  Pulse 76  Temp(Src) 98.1 F (36.7 C) (Oral)  Resp 20  SpO2 98% Physical Exam  Constitutional: He appears well-developed and well-nourished. No distress.  HENT:  Head: Normocephalic and atraumatic.  Mouth/Throat: Oropharynx is clear and moist. No oropharyngeal exudate.  Eyes: Conjunctivae and EOM are normal. Pupils are equal, round, and reactive to light. Right eye exhibits no discharge. Left eye exhibits no discharge. No scleral icterus.  Neck: Normal range of motion. Neck supple. No JVD present. No thyromegaly present.  Cardiovascular: Normal rate, regular rhythm, normal heart sounds and intact distal pulses.  Exam reveals no gallop and no friction rub.   No murmur heard. Pulmonary/Chest: Effort normal and breath sounds normal. No respiratory distress. He has no wheezes. He has no rales.  Abdominal: Soft.  Bowel sounds are normal. He exhibits no distension and no mass. There is tenderness ( Epigastric tenderness to palpation which is reproducible, patient states this is the pain that he is having).  Musculoskeletal: Normal range of motion. He exhibits no edema or tenderness.  Lymphadenopathy:    He has no cervical adenopathy.  Neurological: He is alert. Coordination normal.  Skin: Skin is warm and dry. No rash noted. No erythema.  Psychiatric: He has a normal mood and  affect. His behavior is normal.  Nursing note and vitals reviewed.   ED Course  Procedures (including critical care time) Labs Review Labs Reviewed  BASIC METABOLIC PANEL - Abnormal; Notable for the following:    Potassium 3.4 (*)    Glucose, Bld 139 (*)    All other components within normal limits  CBC - Abnormal; Notable for the following:    Platelets 144 (*)    All other components within normal limits  HEPATIC FUNCTION PANEL - Abnormal; Notable for the following:    Bilirubin, Direct <0.1 (*)    All other components within normal limits  LIPASE, BLOOD  I-STAT TROPOININ, ED    Imaging Review Dg Chest 2 View  10/18/2015  CLINICAL DATA:  Centralized chest pain, shortness of breath and vomiting tonight. EXAM: CHEST  2 VIEW COMPARISON:  Chest radiograph August 05, 2015 FINDINGS: Cardiomediastinal silhouette is normal. Mild bronchitic changes. The lungs are clear without pleural effusions or focal consolidations. Trachea projects midline and there is no pneumothorax. Soft tissue planes and included osseous structures are non-suspicious. IMPRESSION: Mild bronchitic changes without focal consolidation. Electronically Signed   By: Awilda Metroourtnay  Bloomer M.D.   On: 10/18/2015 05:31   I have personally reviewed and evaluated these images and lab results as part of my medical decision-making.   EKG Interpretation   Date/Time:  Monday October 18 2015 04:32:26 EST Ventricular Rate:  71 PR Interval:  148 QRS Duration: 93 QT Interval:  364 QTC Calculation: 395 R Axis:   80 Text Interpretation:  Sinus rhythm Normal ECG since last tracing no  significant change Confirmed by Shaheem Pichon  MD, Levie Owensby ([id number]) on 10/18/2015  4:37:22 AM      MDM   Final diagnoses:  Chest pain, unspecified chest pain type    Well-appearing, normal EKG, normal vital signs other than mild hypertension diastolic. Suspect gastrointestinal source, possibly anginal though the patient has had a very clean workup in  the past and there is no signs on the EKG that this is related to a cardiac source. Medications given as below, lab work pending to rule out pancreatitis, gallbladder or other sources of symptoms.  CP has improved with fentanyl - labs and imaging negative - VS normal - pt stable for d/c.  Doubt PE or ACS  Meds given in ED:  Medications  oxyCODONE-acetaminophen (PERCOCET/ROXICET) 5-325 MG per tablet 2 tablet (2 tablets Oral Not Given 10/18/15 0612)  ondansetron (ZOFRAN) injection 4 mg (4 mg Intravenous Given 10/18/15 0503)  gi cocktail (Maalox,Lidocaine,Donnatal) (30 mLs Oral Given 10/18/15 0526)  famotidine (PEPCID) IVPB 20 mg premix (0 mg Intravenous Stopped 10/18/15 0613)  pantoprazole (PROTONIX) injection 40 mg (40 mg Intravenous Given 10/18/15 0532)  prochlorperazine (COMPAZINE) injection 10 mg (10 mg Intravenous Given 10/18/15 0544)  sodium chloride 0.9 % bolus 1,000 mL (1,000 mLs Intravenous New Bag/Given 10/18/15 0544)  fentaNYL (SUBLIMAZE) injection 100 mcg (100 mcg Intravenous Given 10/18/15 [id number])    New Prescriptions   No medications on file  Eber Hong, MD 10/18/15 [phone]

## 2015-10-18 NOTE — ED Notes (Signed)
MD aware pt still nauseated. Awaiting further orders.

## 2015-10-18 NOTE — ED Notes (Signed)
Dr. Miller at the bedside.  

## 2015-10-18 NOTE — ED Notes (Signed)
Dr. Hyacinth MeekerMiller advised pt requesting something else for pain.

## 2015-10-18 NOTE — ED Notes (Signed)
Pt. Woke up this morning with central chest pain that feels like someone is squeezing him. Pt. States he has had this pain before and has been told to follow up with a cardiologist but hasnt yet.

## 2015-10-18 NOTE — Discharge Instructions (Signed)

## 2015-11-10 NOTE — Progress Notes (Signed)
Cardiology Office Note   Date:  11/11/2015   ID:  Adam Barker, DOB Sep 13, 1981, MRN [id number]  PCP:  No PCP Per Patient  Cardiologist:   Madilyn Hook, MD   Chief Complaint  Patient presents with  . New Evaluation    PATIENT HAS NO COMPLAINTS.      History of Present Illness: Adam Barker is a 34 y.o. male with depression and anxiety who presents for an evaluation of chest pain.  Adam Barker was seen in the [facility] on 12/5 with substernal chest pain.  His EKG was unremarkable and troponin was undetectable.  His chest pain improved with fentanyl.  BMP was notable for a potassium of 3.4 and glucose 139.  CBC, LFTs and lipase were unremarkable.  Adam Barker has been evaluated for chest pain several times, including exercise treadmill stress testing 18 months ago that was negative for ischemia.    Adam Barker reports intermittent episodes of chest pain that began occuring for at least the last 3-4 years. The pain always awakens him from sleeping. At last for several hours at a time and he has had to go to the emergency department several times. The only thing that alleviates the pain is narcotic pain medicine. He received a prescription for Vicodin, however this makes him nauseous so he is unable to take it.  GI cocktails numb the pain but do not completely alleviate it. The pain is not associated with shortness of breath, nausea, vomiting, or diaphoresis.  It does not radiate.  The pain is 10/10 in severity and substernal.  He reports having a CT scan at an outside hospital that was negative for PE.    Adam Barker works at [employer], which he reports is labor intensive.  He denies any chest pain or shortness of breath with this activity. He does not get any formal exercise.He is not in contact with his father, but thinks thinks that he has angina.  He does not carry a diagnosis of coronary artery disease.  He has no family history of sudden cardiac death or  premature coronary artery disease. Marland Kitchen    Past Medical History  Diagnosis Date  . Depression   . Anxiety     History reviewed. No pertinent past surgical history.   No current outpatient prescriptions on file.   No current facility-administered medications for this visit.    Allergies:   Review of patient's allergies indicates no known allergies.    Social History:  The patient  reports that he has never smoked. He has never used smokeless tobacco. He reports that he does not drink alcohol or use illicit drugs.   Family History:  The patient's family history includes Angina in his father; Hypertension in his brother, father, and sister; Lymphoma in his mother.    ROS:  Please see the history of present illness.   Otherwise, review of systems are positive for anxiety.   All other systems are reviewed and negative.    PHYSICAL EXAM: VS:  BP 126/74 mmHg  Pulse 93  Ht  (1.702 m)  Wt 83.462 kg (184 lb)  BMI 28.81 kg/m2 , BMI Body mass index is 28.81 kg/(m^2). GENERAL:  Well appearing.  Anxious. HEENT:  Pupils equal round and reactive, fundi not visualized, oral mucosa unremarkable NECK:  No jugular venous distention, waveform within normal limits, carotid upstroke brisk and symmetric, no bruits, no thyromegaly LYMPHATICS:  No cervical adenopathy LUNGS:  Clear to auscultation bilaterally  HEART:  RRR.  PMI not displaced or sustained,S1 and S2 within normal limits, no S3, no S4, no clicks, no rubs, no murmurs ABD:  Flat, positive bowel sounds normal in frequency in pitch, no bruits, no rebound, no guarding, no midline pulsatile mass, no hepatomegaly, no splenomegaly EXT:  2 plus pulses throughout, no edema, no cyanosis no clubbing SKIN:  No rashes no nodules NEURO:  Cranial nerves II through XII grossly intact, motor grossly intact throughout PSYCH:  Cognitively intact, oriented to person place and time    EKG:  EKG is not ordered today.   Echo 03/24/14: Study  Conclusions  - Left ventricle: The cavity size was normal. Systolic function was normal. Wall motion was normal; there were no regional wall motion abnormalities. - Mitral valve: Systolic bowing without prolapse.  ETT 03/25/14: 13.4 METS.  No chest pain.  Recent Labs: 10/18/2015: ALT 32; BUN 12; Creatinine, Ser 0.78; Hemoglobin 14.1; Platelets 144*; Potassium 3.4*; Sodium 139    Lipid Panel No results found for: CHOL, TRIG, HDL, CHOLHDL, VLDL, LDLCALC, LDLDIRECT    Wt Readings from Last 3 Encounters:  11/11/15 83.462 kg (184 lb)  03/10/15 80.74 kg (178 lb)  02/25/15 80.74 kg (178 lb)      ASSESSMENT AND PLAN:   # Chest pain: Symptoms are very atypical and do not sound cardiac.  They only occur at rest and not with exertion.  Also, he had an echo, stress test and cardiac enzymes that are normal.  Given that his cardiac enzymes are undetectable, it is unlikely that this represents Prinzmetal angina.  I suspect that his symptoms may be related to GERD, as it only occurs after laying down.  However, he was minimally responsive to GI cocktail.  He appears anxious, but the fact that it only happens when sleeping would argue against panic attacks. - Cardiac CT-A to assess for coronary anomolies and CAD.  If negative, then this is not related to cardiac disease. - If cardiac CT is negative, consider PPI - Will fill out FMLA forms for his prior absences from work.  He will need to find a PCP for further forms, as I do not think is chest pain is cardiac in etiology.  Current medicines are reviewed at length with the patient today.  The patient does not have concerns regarding medicines.  The following changes have been made:  no change  Labs/ tests ordered today include:   Orders Placed This Encounter  Procedures  . CT Coronary Morp W/Cta Cor W/Score W/Ca W/Cm &/Or Wo/Cm     Disposition:   FU with Wilmar Prabhakar C. Duke Salviaandolph, MD, [facility] prn   Signed, Dyan Creelman C. Duke Salviaandolph, MD, [facility]    11/11/2015 12:50 PM    [facility]

## 2015-11-11 ENCOUNTER — Encounter: Payer: Self-pay | Admitting: Cardiovascular Disease

## 2015-11-11 ENCOUNTER — Ambulatory Visit (INDEPENDENT_AMBULATORY_CARE_PROVIDER_SITE_OTHER): Payer: 59 | Admitting: Cardiovascular Disease

## 2015-11-11 VITALS — BP 126/74 | HR 93 | Ht 67.0 in | Wt 184.0 lb

## 2015-11-11 DIAGNOSIS — R079 Chest pain, unspecified: Secondary | ICD-10-CM

## 2015-11-11 NOTE — Patient Instructions (Signed)
Dr Duke Salvia recommends that you have a cardiac CT. This will be done at the hospital.  Dr Duke Salvia recommends that you follow-up with her as needed.  Cardiac CT Angiogram A cardiac CT angiogram is a test to help your health care provider find out why you are having chest pains or other symptoms of heart disease. The test uses an advanced type of X-ray machine that scans your heart and the area around the heart and creates multiple pictures of it. Other names for the test are coronary CT angiography, coronary artery scanning, and CTA.  The test is painless and fairly quick. It is noninvasive. That means it does not involve any type of surgery or cuts (incisions). Instead, a fluid called contrast dye is injected into an IV tube in your arm. The contrast dye acts as a highlighter as it flows through the veins. With the CT scan, it lets your health care provider see:   If the coronary arteries in your heart are more narrow than they should be, or if they are blocked.  If there is fluid around the heart.  If the muscles and tissues of the heart look weak or show signs of disease.  If the lungs contain any blood clots. LET [facility] CARE PROVIDER KNOW ABOUT:  Any allergies you have.   All medicines you are taking, including vitamins, herbs, eye drops, creams, and over-the-counter medicines.  Previous problems you or members of your family have had with the use of anesthetics.  Any blood disorders you have.  Previous surgeries you have had.  Medical conditions you have. RISKS AND COMPLICATIONS Generally, this is a safe procedure. However, as with any procedure, problems can occur. Possible problems include:   Allergic reaction to the contrast dye. This can range from mild to severe and may include:   Itching at the IV tube insertion site.   Redness at the IV tube insertion site.   Hives.   Nausea.   Difficulty breathing.   Kidney failure.   Problems from radiation  exposure. This test involves the use of radiation. Radiation exposure can be dangerous to a pregnant patient and fetus. If you are pregnant, shields are used to protect your belly and pelvic area. More details are available from your health care provider. BEFORE THE PROCEDURE  The day before the test:    Stop drinking caffeinated beverages. These include energy drinks, tea, soda, coffee, and hot chocolate.  Stop taking medicines to treat erectile dysfunction. They can interfere with medicines you may be given during the procedure. Check with your health care provider if you should stop taking any other medicines. On the day of the test:   About 4 hours before the test, stop eating and drinking anything but water as advised by your health care provider.  Avoid wearing jewelry. You will have to undress from the waist up and wear a hospital gown. PROCEDURE  The hair on your chest may need to be shaved. This is done because small sticky patches called electrodes are put on your chest. These transmit information that helps monitor your heart during the test.  You might be given heart medicine during the test. This is done to control your heart rate during the test so a good image is obtained.  An IV tube will be inserted in your arm.  You will be asked to lie on a table with your arms above your head.  The contrast dye will be injected into the IV tube. You  might feel warm or you may get a metallic taste in your mouth.  The table you are lying on will move into a large machine that will do the scanning.  You will be able to see, hear, and talk to the person running the machine while you are in it. Follow that person's directions. You may be asked to hold your breath for 2-3 seconds as pictures are taken.  The CT machine will move around you to take pictures. Do not move while it is scanning. This helps to get a good image of your heart.  When the best possible pictures have been taken,  the machine will be turned off. The table will move out of the machine. The IV tube will then be removed. AFTER THE PROCEDURE  You will be allowed to get dressed and return to your normal activities.  Results will be interpreted by the health care provider and the results will be discussed with you.   This information is not intended to replace advice given to you by your health care provider. Make sure you discuss any questions you have with your health care provider.   Document Released: 10/12/2008 Document Revised: 11/20/2014 Document Reviewed: 07/16/2013 Elsevier Interactive Patient Education [employer]

## 2015-11-17 ENCOUNTER — Encounter: Payer: Self-pay | Admitting: Cardiovascular Disease

## 2015-11-19 ENCOUNTER — Ambulatory Visit: Payer: Self-pay | Admitting: Cardiovascular Disease

## 2015-12-02 ENCOUNTER — Ambulatory Visit (HOSPITAL_COMMUNITY): Payer: 59

## 2016-08-28 IMAGING — CR DG CHEST 2V
2 series · 2 of 2 positions shown · non-contrast
Comparison: 03/22/2014

CLINICAL DATA: Chest pain starting at 5 a.m..

EXAM:
CHEST  2 VIEW

[w chest lat]
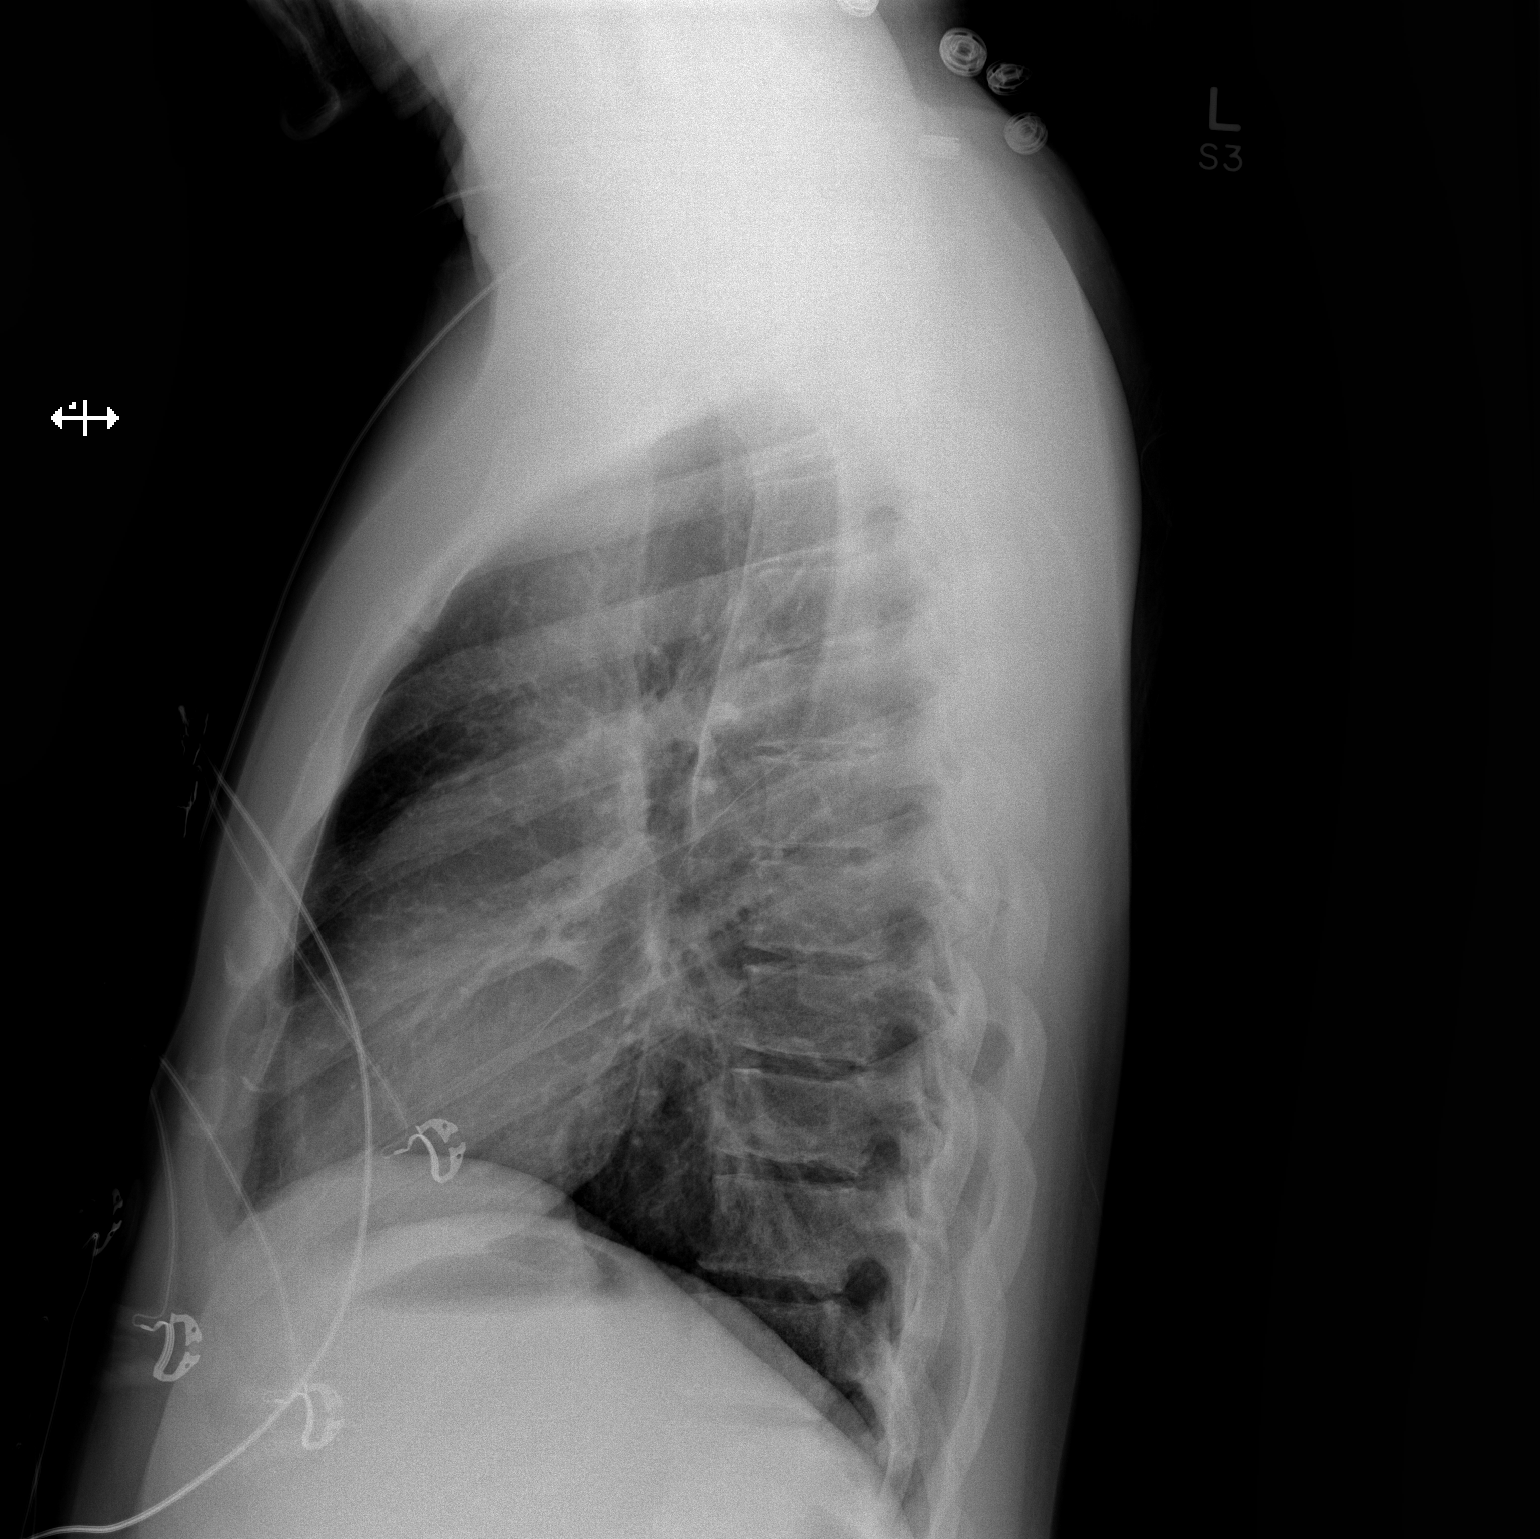

[w chest pa]
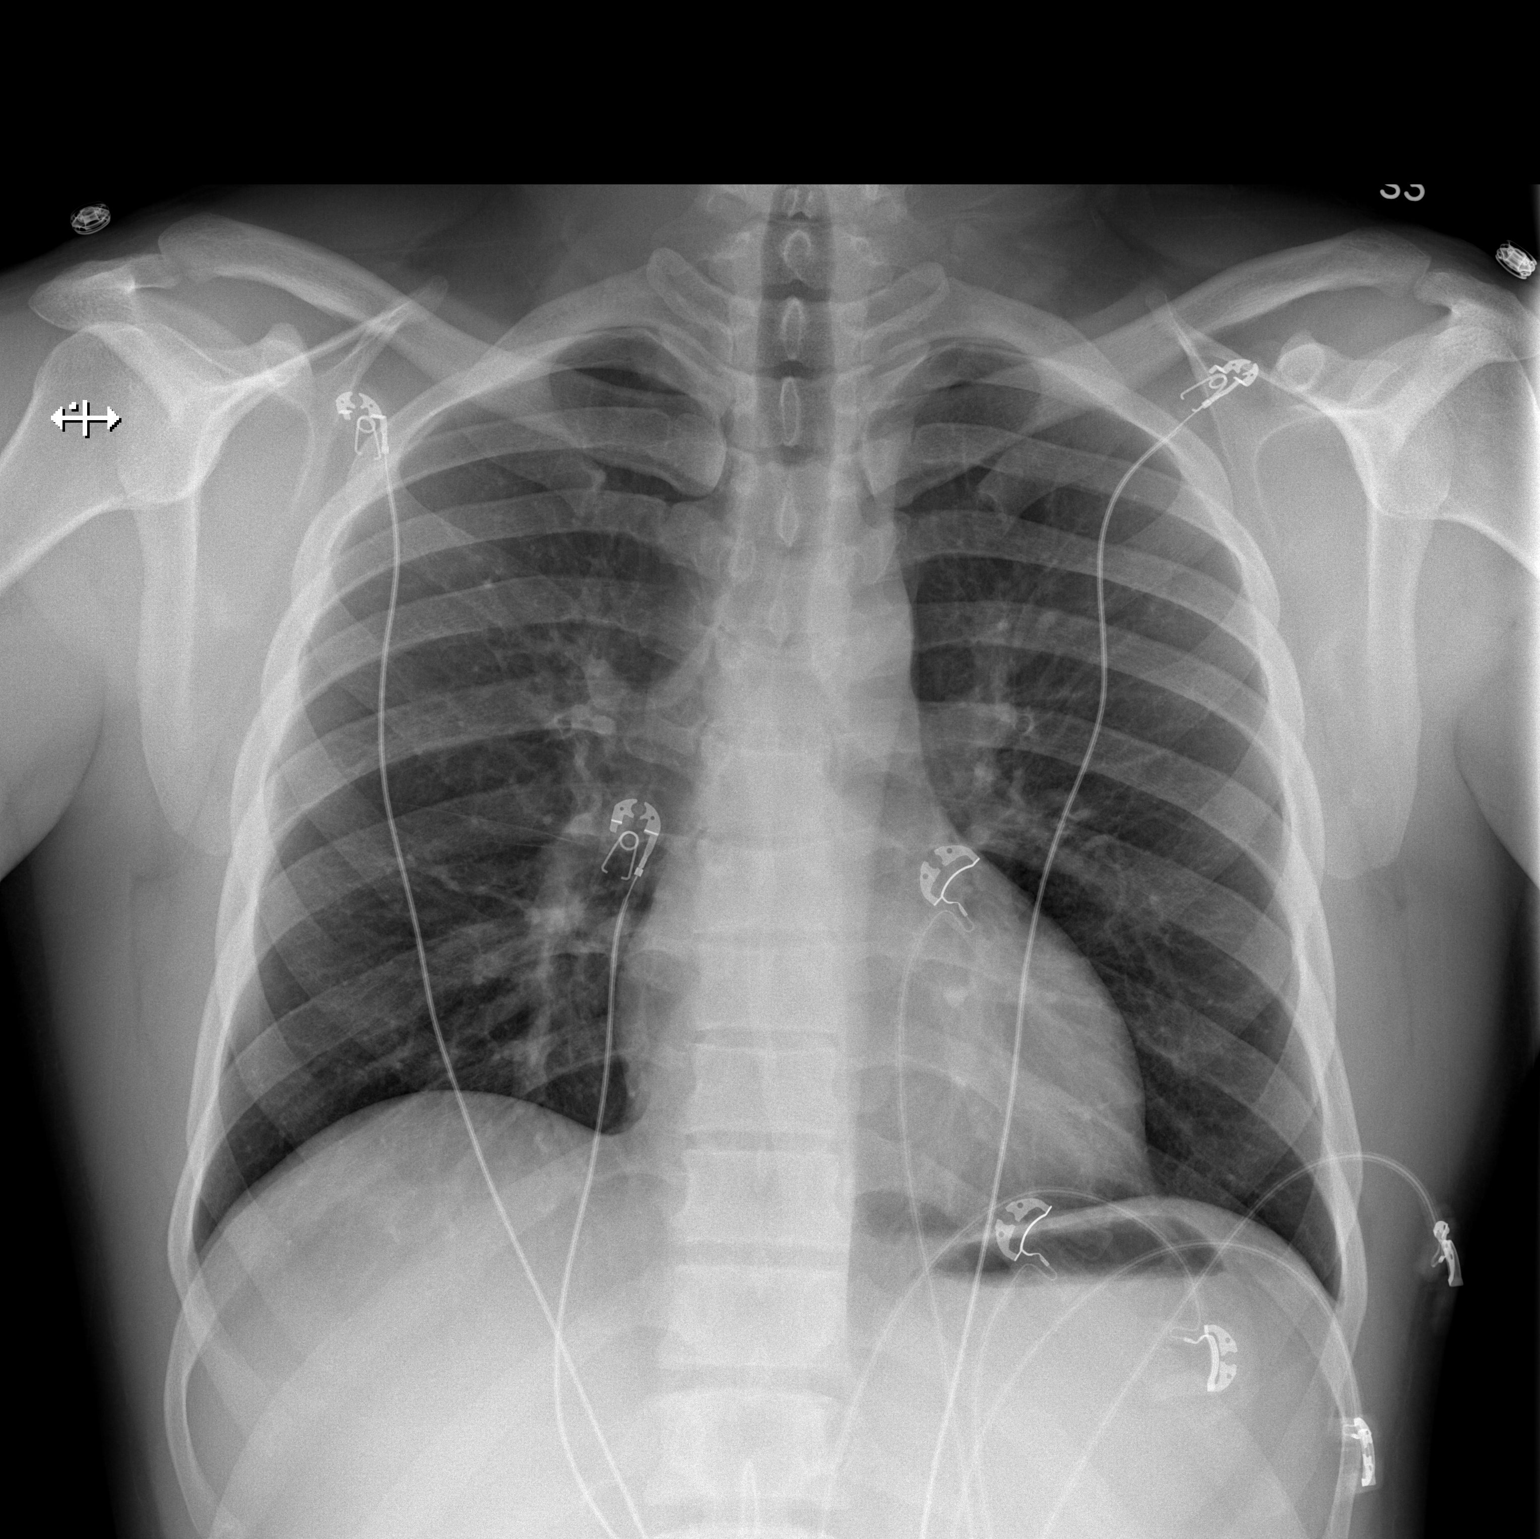

[2 of 2 positions shown; findings below may reference images not displayed]

FINDINGS: The heart size and mediastinal contours are within normal limits.
Both lungs are clear. The visualized skeletal structures are
unremarkable.
IMPRESSION: No active cardiopulmonary disease.

## 2016-11-10 IMAGING — DX DG CHEST 2V
2 series · 2 of 2 positions shown · non-contrast
Comparison: Chest radiograph August 05, 2015

CLINICAL DATA: Centralized chest pain, shortness of breath and
vomiting tonight.

EXAM:
CHEST  2 VIEW

[chest pa]
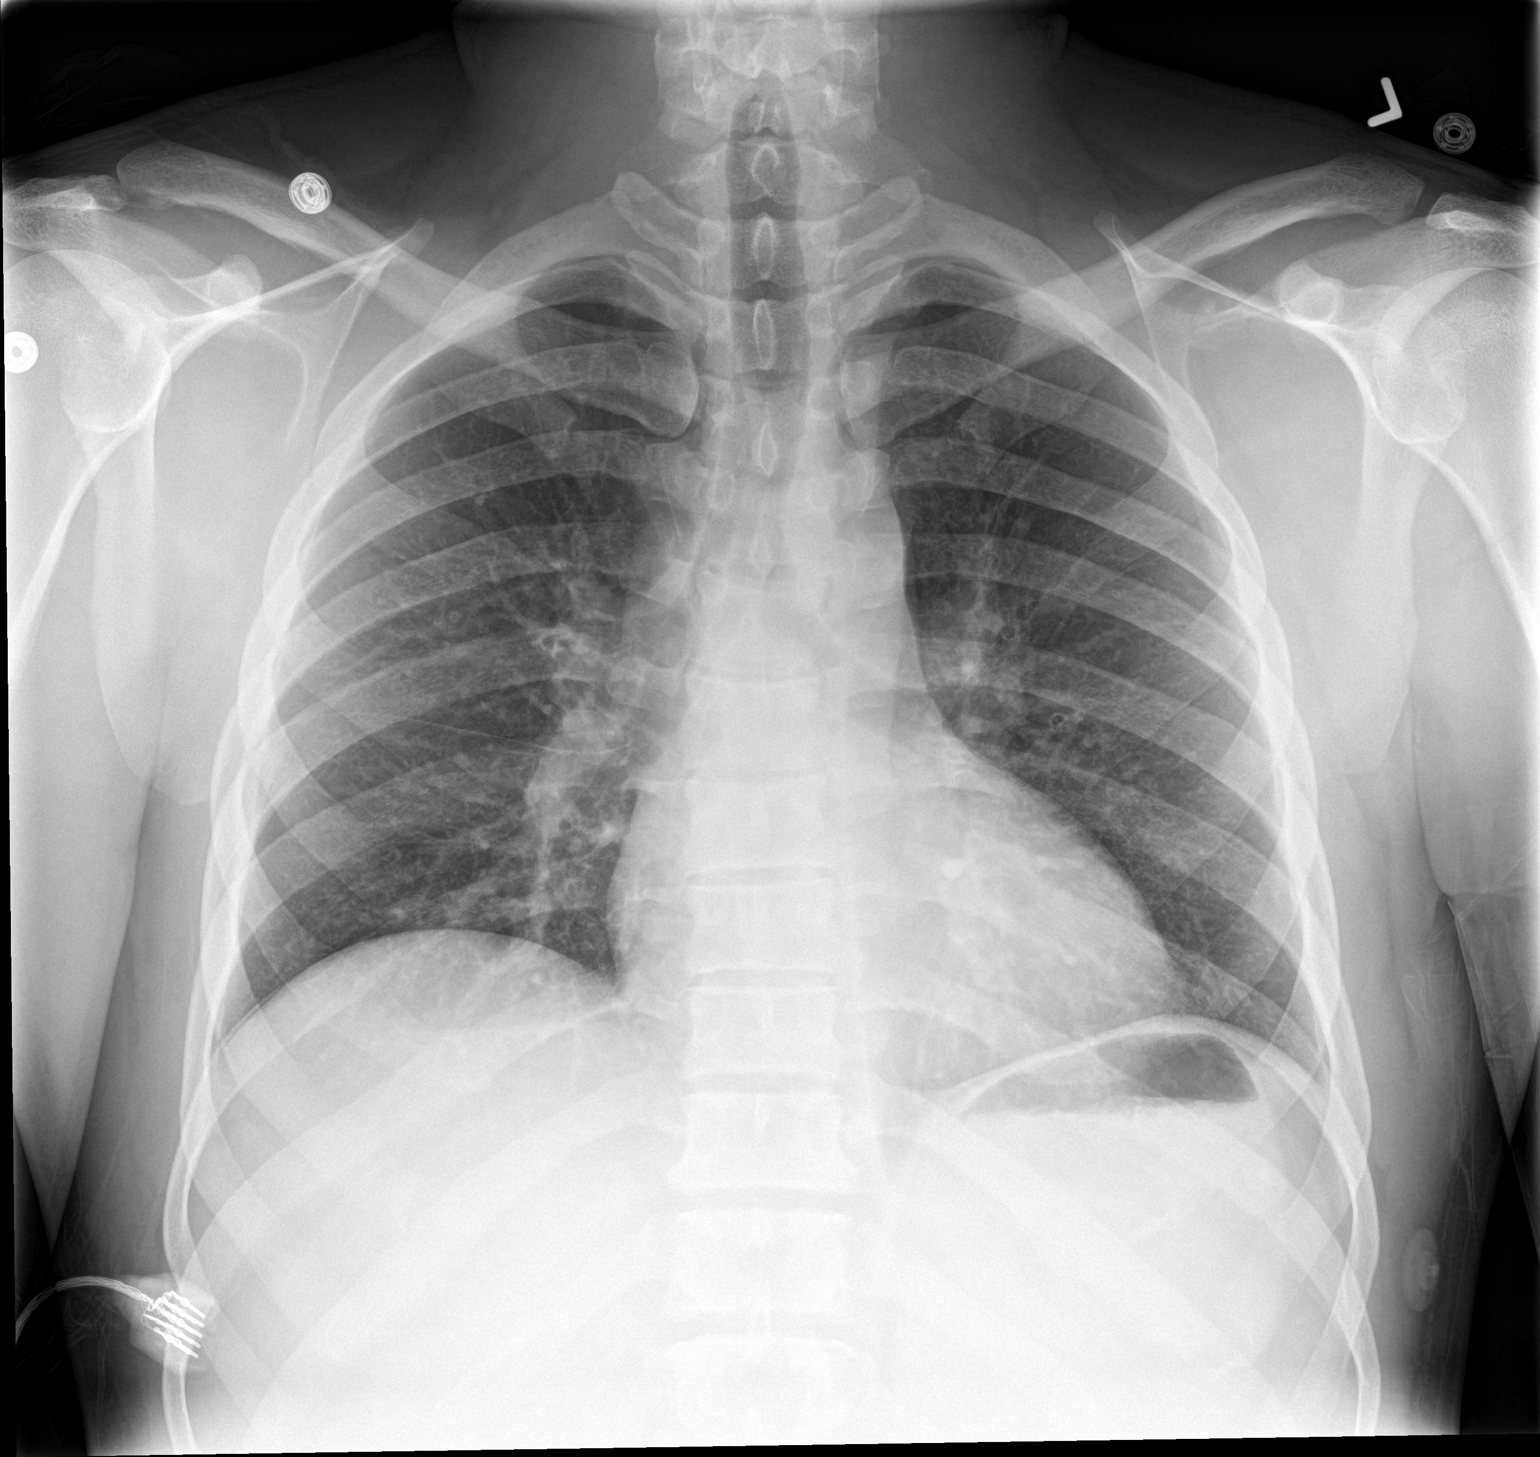

[chest lat]
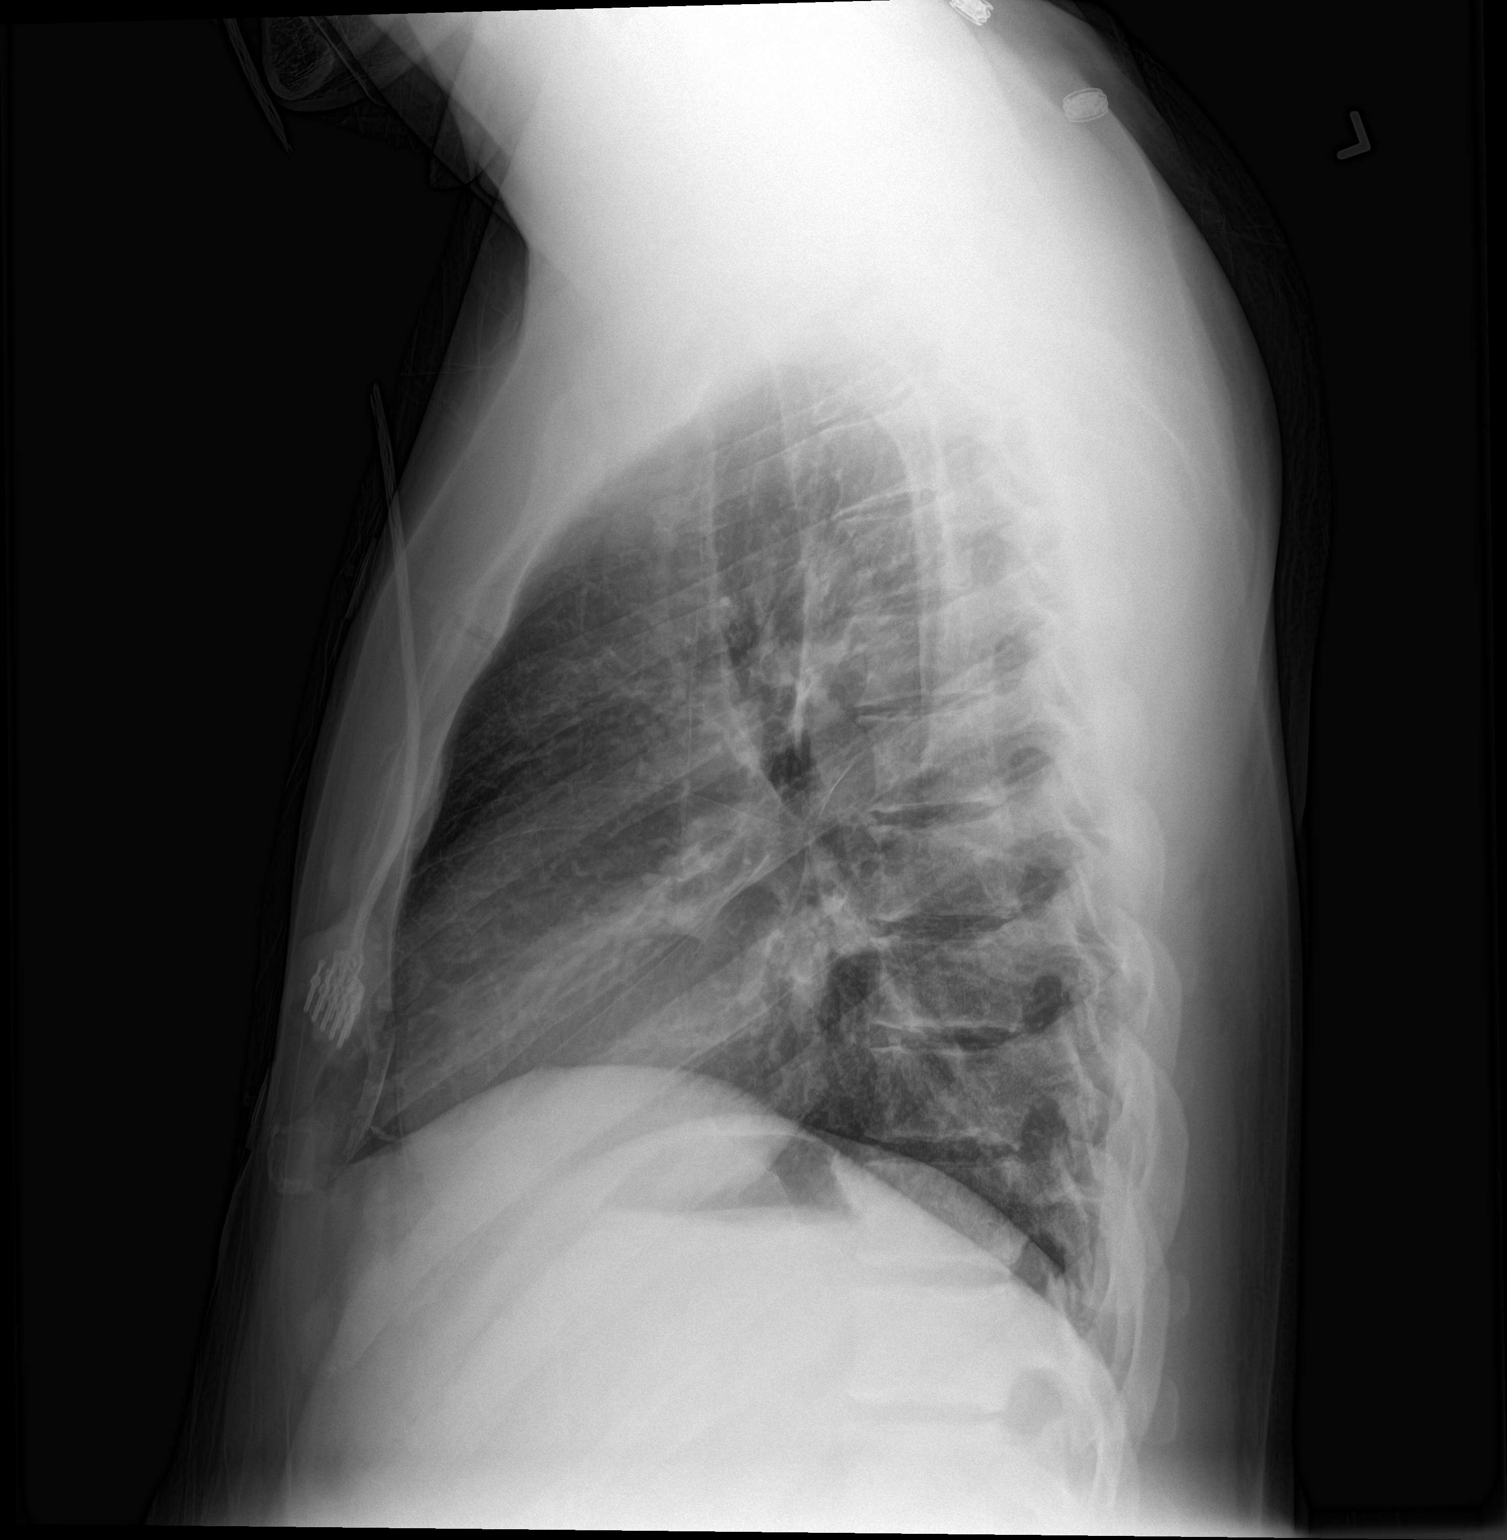

[2 of 2 positions shown; findings below may reference images not displayed]

FINDINGS: Cardiomediastinal silhouette is normal. Mild bronchitic changes. The
lungs are clear without pleural effusions or focal consolidations.
Trachea projects midline and there is no pneumothorax. Soft tissue
planes and included osseous structures are non-suspicious.
IMPRESSION: Mild bronchitic changes without focal consolidation.
